# Patient Record
Sex: Female | Born: 1995
Health system: Southern US, Community
[De-identification: ages and names within clinical notes are randomized; demographics above are authoritative.]

## PROBLEM LIST (undated history)

## (undated) HISTORY — PX: MOUTH SURGERY: SHX715

---
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
## 2016-06-21 ENCOUNTER — Encounter (HOSPITAL_BASED_OUTPATIENT_CLINIC_OR_DEPARTMENT_OTHER): Payer: Self-pay | Admitting: *Deleted

## 2016-06-21 ENCOUNTER — Emergency Department (HOSPITAL_BASED_OUTPATIENT_CLINIC_OR_DEPARTMENT_OTHER)
Admission: EM | Admit: 2016-06-21 | Discharge: 2016-06-21 | Disposition: A | Discharge status: Home / Self Care | Payer: Medicaid Other | Attending: Emergency Medicine | Admitting: Emergency Medicine

## 2016-06-21 ENCOUNTER — Emergency Department (HOSPITAL_BASED_OUTPATIENT_CLINIC_OR_DEPARTMENT_OTHER): Payer: Medicaid Other

## 2016-06-21 DIAGNOSIS — M791 Myalgia: Secondary | ICD-10-CM | POA: Insufficient documentation

## 2016-06-21 DIAGNOSIS — R69 Illness, unspecified: Secondary | ICD-10-CM

## 2016-06-21 DIAGNOSIS — R51 Headache: Secondary | ICD-10-CM | POA: Diagnosis not present

## 2016-06-21 DIAGNOSIS — J029 Acute pharyngitis, unspecified: Secondary | ICD-10-CM | POA: Insufficient documentation

## 2016-06-21 DIAGNOSIS — R05 Cough: Secondary | ICD-10-CM | POA: Diagnosis present

## 2016-06-21 DIAGNOSIS — J111 Influenza due to unidentified influenza virus with other respiratory manifestations: Secondary | ICD-10-CM

## 2016-06-21 DIAGNOSIS — J40 Bronchitis, not specified as acute or chronic: Secondary | ICD-10-CM | POA: Diagnosis not present

## 2016-06-21 MED ORDER — ACETAMINOPHEN 325 MG PO TABS
650.0000 mg | ORAL_TABLET | Freq: Once | ORAL | Status: AC
  Administered 2016-06-21: 650 mg via ORAL
  Filled 2016-06-21: qty 2

## 2016-06-21 MED ORDER — OSELTAMIVIR PHOSPHATE 75 MG PO CAPS: 75.0000 mg | ORAL_CAPSULE | Freq: Two times a day (BID) | ORAL | 0 refills | Status: DC

## 2016-06-21 MED FILL — TAMIFLU 75 MG GELCAP: 75 | 5 days supply | Qty: 10 | Fill #0

## 2016-06-21 NOTE — ED Notes (Signed)
ED Provider at bedside. 

## 2016-06-21 NOTE — ED Provider Notes (Signed)
[facility] Provider Note   CSN: [id number] Arrival date & time: 06/21/16  1224   By signing my name below, I, Teofilo Pod, attest that this documentation has been prepared under the direction and in the presence of Vanetta Mulders, MD . Electronically Signed: Teofilo Pod, ED Scribe. 06/21/2016. 3:26 PM.   History   Chief Complaint Chief Complaint  Patient presents with  . Cough    The history is provided by the patient. No language interpreter was used.   HPI Comments:  Yvonne Hancock is a 21 y.o. female who presents to the Emergency Department complaining of a persistent, productive cough x 2 days. Pt was seen at [facility] pediatrics yesterday and tested positive for flu and was prescribed tamiflu, Z-pak, and prednisone but [facility] was only able to fill the prednisone. Mom reports that pt was coughing with bloody sputum while the pt was at school today. Pt complains of associated chest tightness, headache x 3 days, fever, congestion, sore throat, SOB, and generalized body aches. Pt states that the prednisone has not provided any relief. Pt denies visual changes, chest pain, leg swelling, abdominal pain, diarrhea, vomiting, dysuria, hematuria, and rash.   History reviewed. No pertinent past medical history.  There are no active problems to display for this patient.   Past Surgical History:  Procedure Laterality Date  . MOUTH SURGERY      OB History    No data available       Home Medications    Prior to Admission medications   Medication Sig Start Date End Date Taking? Authorizing Provider  metroNIDAZOLE (FLAGYL) 500 MG tablet Take 1 tablet (500 mg total) by mouth 2 (two) times daily. 04/23/15   Eber Hong, MD  oseltamivir (TAMIFLU) 75 MG capsule Take 1 capsule (75 mg total) by mouth every 12 (twelve) hours. 06/21/16   Vanetta Mulders, MD    Family History No family history on file.  Social History Social History  Substance Use Topics   . Smoking status: Never Smoker  . Smokeless tobacco: Never Used  . Alcohol use No     Allergies   Patient has no known allergies.   Review of Systems Review of Systems  Constitutional: Positive for fever.  HENT: Positive for congestion and sore throat.   Eyes: Negative for visual disturbance.  Respiratory: Positive for cough, chest tightness and shortness of breath.   Cardiovascular: Negative for chest pain and leg swelling.  Gastrointestinal: Negative for abdominal pain, diarrhea and vomiting.  Genitourinary: Negative for dysuria and hematuria.  Musculoskeletal: Positive for myalgias.  Skin: Negative for rash.  Neurological: Positive for headaches.  Hematological: Does not bruise/bleed easily.     Physical Exam Updated Vital Signs BP 120/77 (BP Location: Left Arm)   Pulse 103   Temp 98.7 F (37.1 C) (Oral)   Resp 20   Ht 5' 1.5" (1.562 m)   Wt 63.5 kg   LMP  (LMP Unknown)   SpO2 97%   BMI 26.02 kg/m   Physical Exam  Constitutional: She is oriented to person, place, and time. She appears well-developed and well-nourished. No distress.  HENT:  Head: Normocephalic and atraumatic.  Mouth/Throat: Oropharynx is clear and moist.  Eyes: Conjunctivae and EOM are normal. Pupils are equal, round, and reactive to light.  Sclera clear, eyes tracking normal.   Neck: Neck supple.  Cardiovascular: Normal rate and regular rhythm.   No murmur heard. Tachycardic.  Pulmonary/Chest: Effort normal and breath sounds normal. No  respiratory distress.  Decreased breath sounds. Lungs clear.  Abdominal: Soft. Bowel sounds are normal. There is no tenderness.  Musculoskeletal: Normal range of motion. She exhibits no edema.  Neurological: She is alert and oriented to person, place, and time. No cranial nerve deficit or sensory deficit. She exhibits normal muscle tone. Coordination normal.  Skin: Skin is warm and dry.  Psychiatric: She has a normal mood and affect.  Nursing note and  vitals reviewed.    ED Treatments / Results  DIAGNOSTIC STUDIES:  Oxygen Saturation is 97% on RA, normal by my interpretation.    COORDINATION OF CARE:  3:22 PM Will order chest x-ray. Discussed treatment plan with pt at bedside and pt agreed to plan.   Labs (all labs ordered are listed, but only abnormal results are displayed) Labs Reviewed - No data to display  EKG  EKG Interpretation None       Radiology Dg Chest 2 View  Result Date: 06/21/2016 CLINICAL DATA:  Headache, cough and fever for 2 days. EXAM: CHEST  2 VIEW COMPARISON:  06/04/2016 FINDINGS: The heart size and mediastinal contours are within normal limits. Both lungs are clear. Mild peribronchial thickening. The visualized skeletal structures are unremarkable. IMPRESSION: Mild peribronchial thickening could suggest bronchitis. No infiltrates or effusions. Electronically Signed   By: Rudie MeyerP.  Gallerani M.D.   On: 06/21/2016 15:47    Procedures Procedures (including critical care time)  Medications Ordered in ED Medications  acetaminophen (TYLENOL) tablet 650 mg (650 mg Oral Given 06/21/16 1248)     Initial Impression / Assessment and Plan / ED Course  I have reviewed the triage vital signs and the nursing notes.  Pertinent labs & imaging results that were available during my care of the patient were reviewed by me and considered in my medical decision making (see chart for details).    Symptoms consistent with flulike illness. Patient also did have positive influenza test at primary care doctor's office yesterday. Patient's coughing up a Bungert bit of blood with phlegm. Consistent with a bronchitis. Chest x-rays negative for pneumonia. Will have patient continue her prednisone encouraged her to start the Tamiflu today. Patient to take over-the-counter cold and flu meds as well. Patient nontoxic no acute distress.   Final Clinical Impressions(s) / ED Diagnoses   Final diagnoses:  Influenza-like illness    Bronchitis    New Prescriptions New Prescriptions   OSELTAMIVIR (TAMIFLU) 75 MG CAPSULE    Take 1 capsule (75 mg total) by mouth every 12 (twelve) hours.    I personally performed the services described in this documentation, which was scribed in my presence. The recorded information has been reviewed and is accurate.       Vanetta MuldersScott Marga Gramajo, MD 06/21/16 [phone]

## 2016-06-21 NOTE — Discharge Instructions (Signed)
Take the Tamiflu as directed. Continue take the prednisone as prescribed by your primary care doctor. Chest x-rays negative for pneumonia. Taking Zithromax will be very helpful. Recommend also over-the-counter cold and flu medicines. Return for any new or worse symptoms.

## 2016-06-21 NOTE — ED Triage Notes (Signed)
She has been seen x 2 for cough. She had a positive flu test yesterday. She was started on Prednisone, Tamaflu an Zpak. She saw streaks of blood in her cough returns this am.

## 2016-08-15 ENCOUNTER — Encounter (HOSPITAL_BASED_OUTPATIENT_CLINIC_OR_DEPARTMENT_OTHER): Payer: Self-pay | Admitting: Emergency Medicine

## 2016-08-15 ENCOUNTER — Emergency Department (HOSPITAL_BASED_OUTPATIENT_CLINIC_OR_DEPARTMENT_OTHER)
Admission: EM | Admit: 2016-08-15 | Discharge: 2016-08-16 | Disposition: A | Discharge status: Home / Self Care | Payer: Medicaid Other | Attending: Emergency Medicine | Admitting: Emergency Medicine

## 2016-08-15 DIAGNOSIS — N760 Acute vaginitis: Secondary | ICD-10-CM | POA: Insufficient documentation

## 2016-08-15 DIAGNOSIS — B9689 Other specified bacterial agents as the cause of diseases classified elsewhere: Secondary | ICD-10-CM

## 2016-08-15 DIAGNOSIS — N898 Other specified noninflammatory disorders of vagina: Secondary | ICD-10-CM | POA: Diagnosis present

## 2016-08-15 LAB — URINALYSIS, ROUTINE W REFLEX MICROSCOPIC
BILIRUBIN URINE: NEGATIVE
GLUCOSE, UA: NEGATIVE mg/dL
Hgb urine dipstick: NEGATIVE
Ketones, ur: NEGATIVE mg/dL
Leukocytes, UA: NEGATIVE
Nitrite: NEGATIVE
PH: 8 (ref 5.0–8.0)
Protein, ur: NEGATIVE mg/dL
SPECIFIC GRAVITY, URINE: 1.024 (ref 1.005–1.030)

## 2016-08-15 LAB — URINALYSIS, MICROSCOPIC (REFLEX)

## 2016-08-15 LAB — PREGNANCY, URINE: Preg Test, Ur: NEGATIVE

## 2016-08-15 NOTE — ED Triage Notes (Signed)
Patient reports intermittent "sharp" pain in right lower back x 1 week.  Denies injury, dysuria, hematuria.

## 2016-08-15 NOTE — ED Notes (Signed)
Pt reports thick white vaginal d/c since Monday, pt reports recent unprotected sex. Pt denies dysuria.

## 2016-08-15 NOTE — ED Provider Notes (Signed)
[facility] Provider Note   CSN: [id number] Arrival date & time: 08/15/16  2253   By signing my name below, I, Yvonne Hancock, attest that this documentation has been prepared under the direction and in the presence of Yvonne Nienhuis, MD . Electronically Signed: Teofilo PodMatthew P. Hancock, ED Scribe. 08/15/2016. 11:56 PM.   History   Chief Complaint Chief Complaint  Patient presents with  . Back Pain  . Vaginal Discharge    The history is provided by the patient. No language interpreter was used.  Vaginal Discharge   This is a new problem. The current episode started more than 2 days ago. The problem occurs constantly. The problem has not changed since onset.The discharge occurs spontaneously. The discharge was white. She is not pregnant. She has not missed her period. Pertinent negatives include no fever, no abdominal pain, no genital lesions and no perineal odor. She has tried nothing for the symptoms. The treatment provided no relief. Her past medical history does not include irregular periods.   HPI Comments:  Yvonne Hancock is a 21 y.o. female who presents to the Emergency Department complaining of persistent vaginal discharge that began 1 weeks ago. Pt reports hx of yeast infection and states that the discharge is similar. She describes the discharge as white. Pt was given a cream for her last yeast infection.    History reviewed. No pertinent past medical history.  There are no active problems to display for this patient.   Past Surgical History:  Procedure Laterality Date  . MOUTH SURGERY      OB History    No data available       Home Medications    Prior to Admission medications   Medication Sig Start Date End Date Taking? Authorizing Provider  amoxicillin (AMOXIL) 500 MG tablet Take 400 mg by mouth 2 (two) times daily.   Yes Historical Provider, MD  metroNIDAZOLE (FLAGYL) 500 MG tablet Take 1 tablet (500 mg total) by mouth 2 (two) times daily.  04/23/15   Eber HongBrian Miller, MD  oseltamivir (TAMIFLU) 75 MG capsule Take 1 capsule (75 mg total) by mouth every 12 (twelve) hours. 06/21/16   Vanetta MuldersScott Zackowski, MD    Family History History reviewed. No pertinent family history.  Social History Social History  Substance Use Topics  . Smoking status: Never Smoker  . Smokeless tobacco: Never Used  . Alcohol use No     Allergies   Patient has no known allergies.   Review of Systems Review of Systems  Constitutional: Negative for fever.  Gastrointestinal: Negative for abdominal pain.  Genitourinary: Positive for vaginal discharge. Negative for difficulty urinating, genital sores and vaginal bleeding.  Neurological: Negative for syncope, weakness and numbness.  All other systems reviewed and are negative.    Physical Exam Updated Vital Signs BP 126/80 (BP Location: Left Arm)   Pulse 84   Temp 97.9 F (36.6 C) (Oral)   Resp 17   Ht 5\' 1"  (1.549 m)   Wt 131 lb (59.4 kg)   LMP 05/24/2016 (Approximate)   SpO2 100%   BMI 24.75 kg/m   Physical Exam  Constitutional: She is oriented to person, place, and time. She appears well-developed and well-nourished. No distress.  HENT:  Head: Normocephalic and atraumatic.  Mouth/Throat: Oropharynx is clear and moist. No oropharyngeal exudate.  Trachea midline  Eyes: Conjunctivae and EOM are normal. Pupils are equal, round, and reactive to light.  Neck: Trachea normal and normal range of motion. Neck supple. No  JVD present. Carotid bruit is not present.  Cardiovascular: Normal rate, regular rhythm, normal heart sounds and intact distal pulses.  Exam reveals no gallop and no friction rub.   No murmur heard. Pulmonary/Chest: Effort normal and breath sounds normal. No stridor. She has no wheezes. She has no rales.  Abdominal: Soft. Bowel sounds are normal. She exhibits no mass. There is no tenderness. There is no rebound and no guarding.  Genitourinary: Vaginal discharge found.  Genitourinary  Comments: No CMT, no adnexal tenderness; yellow discharge, not copious  Musculoskeletal: Normal range of motion.  Lymphadenopathy:    She has no cervical adenopathy.  Neurological: She is alert and oriented to person, place, and time. She has normal reflexes. She displays normal reflexes. No cranial nerve deficit. She exhibits normal muscle tone. Coordination normal.  Cranial nerves 2-12 intact  Skin: Skin is warm and dry. Capillary refill takes less than 2 seconds. She is not diaphoretic.  Psychiatric: She has a normal mood and affect. Her behavior is normal.     ED Treatments / Results   Vitals:   08/15/16 2258 08/16/16 0107  BP: 126/80 (!) 103/57  Pulse: 84 66  Resp: 17 14  Temp: 97.9 F (36.6 C)     DIAGNOSTIC STUDIES:  Oxygen Saturation is 100% on RA, normal by my interpretation.    COORDINATION OF CARE:  11:56 PM Discussed treatment plan with pt at bedside and pt agreed to plan.   Labs (all labs ordered are listed, but only abnormal results are displayed) Results for orders placed or performed during the hospital encounter of 08/15/16  Wet prep, genital  Result Value Ref Range   Yeast Wet Prep HPF POC NONE SEEN NONE SEEN   Trich, Wet Prep NONE SEEN NONE SEEN   Clue Cells Wet Prep HPF POC PRESENT (A) NONE SEEN   WBC, Wet Prep HPF POC MODERATE (A) NONE SEEN   Sperm NONE SEEN   Urinalysis, Routine w reflex microscopic  Result Value Ref Range   Color, Urine YELLOW YELLOW   APPearance TURBID (A) CLEAR   Specific Gravity, Urine 1.024 1.005 - 1.030   pH 8.0 5.0 - 8.0   Glucose, UA NEGATIVE NEGATIVE mg/dL   Hgb urine dipstick NEGATIVE NEGATIVE   Bilirubin Urine NEGATIVE NEGATIVE   Ketones, ur NEGATIVE NEGATIVE mg/dL   Protein, ur NEGATIVE NEGATIVE mg/dL   Nitrite NEGATIVE NEGATIVE   Leukocytes, UA NEGATIVE NEGATIVE  Pregnancy, urine  Result Value Ref Range   Preg Test, Ur NEGATIVE NEGATIVE  Urinalysis, Microscopic (reflex)  Result Value Ref Range   RBC / HPF  0-5 0 - 5 RBC/hpf   WBC, UA 0-5 0 - 5 WBC/hpf   Bacteria, UA RARE (A) NONE SEEN   Squamous Epithelial / LPF 0-5 (A) NONE SEEN   Amorphous Crystal PRESENT    No results found.  Procedures Procedures (including critical care time)  Medications Ordered in ED Medications  metroNIDAZOLE (FLAGYL) tablet 500 mg (500 mg Oral Given 08/16/16 0143)     Final Clinical Impressions(s) / ED Diagnoses  Bacterial vaginosis:  Will treat for same.  The patient is nontoxic-appearing on exam and vital signs are within normal limits. Return for weakness, vomiting, worsening discharge, fevers abdominal pain or any concerns.   After history, exam, and medical workup I feel the patient has been appropriately medically screened and is safe for discharge home. Pertinent diagnoses were discussed with the patient. Patient was given return precautions.  I personally performed the services described  in this documentation, which was scribed in my presence. The recorded information has been reviewed and is accurate.        Cy Blamer, MD 08/16/16 [phone]

## 2016-08-16 ENCOUNTER — Encounter (HOSPITAL_BASED_OUTPATIENT_CLINIC_OR_DEPARTMENT_OTHER): Payer: Self-pay | Admitting: Emergency Medicine

## 2016-08-16 LAB — GC/CHLAMYDIA PROBE AMP (~~LOC~~) NOT AT ARMC
CHLAMYDIA, DNA PROBE: NEGATIVE
NEISSERIA GONORRHEA: NEGATIVE

## 2016-08-16 LAB — WET PREP, GENITAL
SPERM: NONE SEEN
Trich, Wet Prep: NONE SEEN
Yeast Wet Prep HPF POC: NONE SEEN

## 2016-08-16 MED ORDER — METRONIDAZOLE 500 MG PO TABS: 500.0000 mg | ORAL_TABLET | Freq: Two times a day (BID) | ORAL | 0 refills | Status: DC

## 2016-08-16 MED ORDER — METRONIDAZOLE 500 MG PO TABS
500.0000 mg | ORAL_TABLET | Freq: Once | ORAL | Status: AC
  Administered 2016-08-16: 500 mg via ORAL
  Filled 2016-08-16: qty 1

## 2016-12-13 ENCOUNTER — Emergency Department (HOSPITAL_BASED_OUTPATIENT_CLINIC_OR_DEPARTMENT_OTHER)
Admission: EM | Admit: 2016-12-13 | Discharge: 2016-12-13 | Disposition: A | Discharge status: Home / Self Care | Payer: Medicaid Other | Attending: Emergency Medicine | Admitting: Emergency Medicine

## 2016-12-13 ENCOUNTER — Encounter (HOSPITAL_BASED_OUTPATIENT_CLINIC_OR_DEPARTMENT_OTHER): Payer: Self-pay

## 2016-12-13 DIAGNOSIS — N939 Abnormal uterine and vaginal bleeding, unspecified: Secondary | ICD-10-CM | POA: Insufficient documentation

## 2016-12-13 LAB — URINALYSIS, MICROSCOPIC (REFLEX)

## 2016-12-13 LAB — URINALYSIS, ROUTINE W REFLEX MICROSCOPIC

## 2016-12-13 LAB — PREGNANCY, URINE: Preg Test, Ur: NEGATIVE

## 2016-12-13 NOTE — ED Provider Notes (Signed)
[facility] Provider Note   CSN: [id number] Arrival date & time: 12/13/16  2032  By signing my name below, I, Yvonne Hancock, attest that this documentation has been prepared under the direction and in the presence of physician practitioner, Yvonne Hancock, Yvonne Reger, MD. Electronically Signed: Linna Darnerussell Hancock, Scribe. 12/13/2016. 9:14 PM.  History   Chief Complaint Chief Complaint  Patient presents with  . Vaginal Bleeding   The history is provided by the patient. No language interpreter was used.    HPI Comments: Yvonne Hancock is a 21 y.o. female who presents to the Emergency Department for evaluation of two days of irregular vaginal bleeding. Patient's most recent normal menstrual period ended on 7/11. She reports some associated suprapubic pain, nausea, and vomiting. Patient notes that she has been passing large blood clots. She has tried Aleve without improvement of her abdominal pain. She has no prior h/o irregular menstrual periods. Her BM's have been normal. Patient denies fevers, chills, dysuria, urinary retention, vaginal discharge, or any other associated symptoms.  History reviewed. No pertinent past medical history.  There are no active problems to display for this patient.   Past Surgical History:  Procedure Laterality Date  . MOUTH SURGERY      OB History    No data available       Home Medications    Prior to Admission medications   Not on File    Family History No family history on file.  Social History Social History  Substance Use Topics  . Smoking status: Never Smoker  . Smokeless tobacco: Never Used  . Alcohol use No     Allergies   Patient has no known allergies.   Review of Systems Review of Systems  Constitutional: Negative for chills, diaphoresis, fatigue and fever.  HENT: Negative for congestion, rhinorrhea and sneezing.   Eyes: Negative.   Respiratory: Negative for cough, chest tightness and shortness of breath.     Cardiovascular: Negative for chest pain and leg swelling.  Gastrointestinal: Positive for abdominal pain, nausea and vomiting. Negative for blood in stool, constipation and diarrhea.  Genitourinary: Positive for vaginal bleeding. Negative for difficulty urinating, dysuria, flank pain, frequency and hematuria.  Musculoskeletal: Negative for arthralgias and back pain.  Skin: Negative for rash.  Neurological: Negative for dizziness, speech difficulty, weakness, numbness and headaches.   Physical Exam Updated Vital Signs BP 106/66 (BP Location: Left Arm)   Pulse 70   Temp 98.4 F (36.9 C) (Oral)   Resp 18   Ht 5' (1.524 m)   Wt 55.3 kg (122 lb)   LMP 12/11/2016   SpO2 100%   BMI 23.83 kg/m   Physical Exam  Constitutional: She is oriented to person, place, and time. She appears well-developed and well-nourished.  HENT:  Head: Normocephalic and atraumatic.  Eyes: Pupils are equal, round, and reactive to light.  Neck: Normal range of motion. Neck supple.  Cardiovascular: Normal rate, regular rhythm and normal heart sounds.   Pulmonary/Chest: Effort normal and breath sounds normal. No respiratory distress. She has no wheezes. She has no rales. She exhibits no tenderness.  Abdominal: Soft. Bowel sounds are normal. There is tenderness. There is no rebound and no guarding.  Mild tenderness in the suprapubic area.  Musculoskeletal: Normal range of motion. She exhibits no edema.  Lymphadenopathy:    She has no cervical adenopathy.  Neurological: She is alert and oriented to person, place, and time.  Skin: Skin is warm and dry. No rash noted.  Psychiatric: She  has a normal mood and affect.   ED Treatments / Results  Labs (all labs ordered are listed, but only abnormal results are displayed) Labs Reviewed  URINALYSIS, ROUTINE W REFLEX MICROSCOPIC - Abnormal; Notable for the following:       Result Value   Color, Urine RED (*)    APPearance TURBID (*)    Glucose, UA   (*)    Value:  TEST NOT REPORTED DUE TO COLOR INTERFERENCE OF URINE PIGMENT   Hgb urine dipstick   (*)    Value: TEST NOT REPORTED DUE TO COLOR INTERFERENCE OF URINE PIGMENT   Bilirubin Urine   (*)    Value: TEST NOT REPORTED DUE TO COLOR INTERFERENCE OF URINE PIGMENT   Ketones, ur   (*)    Value: TEST NOT REPORTED DUE TO COLOR INTERFERENCE OF URINE PIGMENT   Protein, ur   (*)    Value: TEST NOT REPORTED DUE TO COLOR INTERFERENCE OF URINE PIGMENT   Nitrite   (*)    Value: TEST NOT REPORTED DUE TO COLOR INTERFERENCE OF URINE PIGMENT   Leukocytes, UA   (*)    Value: TEST NOT REPORTED DUE TO COLOR INTERFERENCE OF URINE PIGMENT   All other components within normal limits  URINALYSIS, MICROSCOPIC (REFLEX) - Abnormal; Notable for the following:    Bacteria, UA FEW (*)    Squamous Epithelial / LPF 0-5 (*)    All other components within normal limits  PREGNANCY, URINE    EKG  EKG Interpretation None       Radiology No results found.  Procedures Procedures (including critical care time)  DIAGNOSTIC STUDIES: Oxygen Saturation is 100% on RA, normal by my interpretation.    COORDINATION OF CARE: 9:12 PM Discussed treatment plan with pt at bedside and pt agreed to plan.  Medications Ordered in ED Medications - No data to display   Initial Impression / Assessment and Plan / ED Course  I have reviewed the triage vital signs and the nursing notes.  Pertinent labs & imaging results that were available during my care of the patient were reviewed by me and considered in my medical decision making (see chart for details).     Patient presents with vaginal bleeding. She doesn't have any heavy bleeding currently. Her abdominal exam is benign. Her pregnancy test is negative. She was discharged home in good condition. She is advised to use ibuprofen for symptomatically. She was encouraged to follow-up with her OB/GYN if her symptoms are not improving.  Final Clinical Impressions(s) / ED Diagnoses    Final diagnoses:  Vaginal bleeding    New Prescriptions New Prescriptions   No medications on file   I personally performed the services described in this documentation, which was scribed in my presence.  The recorded information has been reviewed and considered.    Yvonne Bucco, MD 12/13/16 2148

## 2016-12-13 NOTE — ED Triage Notes (Signed)
C/o vaginal bleeding x 2 days-LMP prior to today bleeding 7/3-NAD-steady gait

## 2018-01-29 ENCOUNTER — Emergency Department (HOSPITAL_BASED_OUTPATIENT_CLINIC_OR_DEPARTMENT_OTHER)
Admission: EM | Admit: 2018-01-29 | Discharge: 2018-01-29 | Disposition: A | Discharge status: Home / Self Care | Payer: Self-pay | Attending: Emergency Medicine | Admitting: Emergency Medicine

## 2018-01-29 ENCOUNTER — Other Ambulatory Visit: Payer: Self-pay

## 2018-01-29 ENCOUNTER — Encounter (HOSPITAL_BASED_OUTPATIENT_CLINIC_OR_DEPARTMENT_OTHER): Payer: Self-pay | Admitting: Adult Health

## 2018-01-29 DIAGNOSIS — X58XXXA Exposure to other specified factors, initial encounter: Secondary | ICD-10-CM | POA: Insufficient documentation

## 2018-01-29 DIAGNOSIS — S61306A Unspecified open wound of right little finger with damage to nail, initial encounter: Secondary | ICD-10-CM | POA: Insufficient documentation

## 2018-01-29 DIAGNOSIS — Y9389 Activity, other specified: Secondary | ICD-10-CM | POA: Insufficient documentation

## 2018-01-29 DIAGNOSIS — S61309A Unspecified open wound of unspecified finger with damage to nail, initial encounter: Secondary | ICD-10-CM

## 2018-01-29 DIAGNOSIS — Y998 Other external cause status: Secondary | ICD-10-CM | POA: Insufficient documentation

## 2018-01-29 DIAGNOSIS — Y929 Unspecified place or not applicable: Secondary | ICD-10-CM | POA: Insufficient documentation

## 2018-01-29 NOTE — ED Triage Notes (Signed)
Presents with no nail to right 5th finger. She states her fake nails pulled the nail off.

## 2018-01-29 NOTE — Discharge Instructions (Signed)
Please read and follow all provided instructions.  Your diagnoses today include:  1. Traumatic avulsion of nail plate of finger, initial encounter     Tests performed today include:  Vital signs. See below for your results today.   Medications prescribed:   None  Home care instructions:  Follow any educational materials contained in this packet.  Follow-up instructions: Please follow-up with your primary care provider as needed for further evaluation of your symptoms.  Return instructions:   Please return to the Emergency Department if you experience worsening symptoms.   Please return if you have any other emergent concerns.  Additional Information:  Your vital signs today were: BP 113/69 (BP Location: Left Arm)    Pulse (!) 112    Temp 98.8 F (37.1 C) (Oral)    Resp 18    Ht 5\' 2"  (1.575 m)    Wt 56.2 kg    SpO2 98%    BMI 22.68 kg/m  If your blood pressure (BP) was elevated above 135/85 this visit, please have this repeated by your doctor within one month. ---------------

## 2018-01-29 NOTE — ED Provider Notes (Signed)
[facility] EMERGENCY DEPARTMENT Provider Note   CSN: [id number] Arrival date & time: 01/29/18  1248     History   Chief Complaint Chief Complaint  Patient presents with  . Nail Problem    HPI Yvonne Hancock is a 22 y.o. female.  Patient presents the emergency department with complaint of fingernail injury to the right small finger.  Patient has fake nails and when she tried to remove it her fingernail tore.  She currently complains of pain.  No bleeding or drainage.  No other injuries reported.     History reviewed. No pertinent past medical history.  There are no active problems to display for this patient.   Past Surgical History:  Procedure Laterality Date  . MOUTH SURGERY       OB History   None      Home Medications    Prior to Admission medications   Not on File    Family History History reviewed. No pertinent family history.  Social History Social History   Tobacco Use  . Smoking status: Never Smoker  . Smokeless tobacco: Never Used  Substance Use Topics  . Alcohol use: No  . Drug use: No     Allergies   Patient has no known allergies.   Review of Systems Review of Systems  Musculoskeletal: Negative for arthralgias and myalgias.  Skin: Positive for wound.     Physical Exam Updated Vital Signs BP 113/69 (BP Location: Left Arm)   Pulse (!) 112   Temp 98.8 F (37.1 C) (Oral)   Resp 18   Ht 5\' 2"  (1.575 m)   Wt 56.2 kg   SpO2 98%   BMI 22.68 kg/m   Physical Exam  Constitutional: She appears well-developed and well-nourished.  HENT:  Head: Normocephalic.  Skin:  Right fifth nail plate removed with approximately the distal one half of the nailbed exposed.  There is no nailbed lacerations.  Small wound on the fingertip, hemostatic.  No indications for repair.  Nail matrix appears intact with remaining nail plate protruding and well adhered to the proximal nail bed.     ED Treatments / Results  Labs (all labs  ordered are listed, but only abnormal results are displayed) Labs Reviewed - No data to display  EKG None  Radiology No results found.  Procedures Procedures (including critical care time)  Medications Ordered in ED Medications - No data to display   Initial Impression / Assessment and Plan / ED Course  I have reviewed the triage vital signs and the nursing notes.  Pertinent labs & imaging results that were available during my care of the patient were reviewed by me and considered in my medical decision making (see chart for details).     Patient seen and examined.   Vital signs reviewed and are as follows: BP 113/69 (BP Location: Left Arm)   Pulse (!) 112   Temp 98.8 F (37.1 C) (Oral)   Resp 18   Ht 5\' 2"  (1.575 m)   Wt 56.2 kg   SpO2 98%   BMI 22.68 kg/m   Patient counseled on good wound care, expectant course of healing.  Return with worsening redness, swelling, bleeding  Final Clinical Impressions(s) / ED Diagnoses   Final diagnoses:  Traumatic avulsion of nail plate of finger, initial encounter   Patient with minor avulsion of the nail plate of the right fifth finger.  No matrix and nail plate is intact proximally.  No nailbed lacerations noted.  Patient counseled on care.  ED Discharge Orders    None       Renne Crigler, PA-C 01/29/18 1311    Sabas Sous, MD 01/29/18 [phone]

## 2019-04-24 ENCOUNTER — Emergency Department (HOSPITAL_BASED_OUTPATIENT_CLINIC_OR_DEPARTMENT_OTHER): Payer: Self-pay

## 2019-04-24 ENCOUNTER — Emergency Department (HOSPITAL_BASED_OUTPATIENT_CLINIC_OR_DEPARTMENT_OTHER)
Admission: EM | Admit: 2019-04-24 | Discharge: 2019-04-25 | Disposition: A | Discharge status: Home / Self Care | Payer: Self-pay | Attending: Emergency Medicine | Admitting: Emergency Medicine

## 2019-04-24 ENCOUNTER — Encounter (HOSPITAL_BASED_OUTPATIENT_CLINIC_OR_DEPARTMENT_OTHER): Payer: Self-pay | Admitting: Emergency Medicine

## 2019-04-24 ENCOUNTER — Other Ambulatory Visit: Payer: Self-pay

## 2019-04-24 DIAGNOSIS — R05 Cough: Secondary | ICD-10-CM | POA: Insufficient documentation

## 2019-04-24 DIAGNOSIS — R112 Nausea with vomiting, unspecified: Secondary | ICD-10-CM

## 2019-04-24 DIAGNOSIS — M791 Myalgia, unspecified site: Secondary | ICD-10-CM

## 2019-04-24 DIAGNOSIS — Z20822 Contact with and (suspected) exposure to covid-19: Secondary | ICD-10-CM

## 2019-04-24 DIAGNOSIS — R430 Anosmia: Secondary | ICD-10-CM

## 2019-04-24 DIAGNOSIS — R059 Cough, unspecified: Secondary | ICD-10-CM

## 2019-04-24 DIAGNOSIS — R6883 Chills (without fever): Secondary | ICD-10-CM

## 2019-04-24 DIAGNOSIS — F17210 Nicotine dependence, cigarettes, uncomplicated: Secondary | ICD-10-CM | POA: Insufficient documentation

## 2019-04-24 DIAGNOSIS — R197 Diarrhea, unspecified: Secondary | ICD-10-CM | POA: Insufficient documentation

## 2019-04-24 DIAGNOSIS — R432 Parageusia: Secondary | ICD-10-CM

## 2019-04-24 DIAGNOSIS — Z20828 Contact with and (suspected) exposure to other viral communicable diseases: Secondary | ICD-10-CM | POA: Insufficient documentation

## 2019-04-24 LAB — CBC WITH DIFFERENTIAL/PLATELET
Abs Immature Granulocytes: 0.01 10*3/uL (ref 0.00–0.07)
Basophils Absolute: 0 10*3/uL (ref 0.0–0.1)
Basophils Relative: 1 %
Eosinophils Absolute: 0.1 10*3/uL (ref 0.0–0.5)
Eosinophils Relative: 2 %
HCT: 37.5 % (ref 36.0–46.0)
Hemoglobin: 11.4 g/dL — ABNORMAL LOW (ref 12.0–15.0)
Immature Granulocytes: 0 %
Lymphocytes Relative: 49 %
Lymphs Abs: 2.7 10*3/uL (ref 0.7–4.0)
MCH: 22.5 pg — ABNORMAL LOW (ref 26.0–34.0)
MCHC: 30.4 g/dL (ref 30.0–36.0)
MCV: 74 fL — ABNORMAL LOW (ref 80.0–100.0)
Monocytes Absolute: 0.5 10*3/uL (ref 0.1–1.0)
Monocytes Relative: 10 %
Neutro Abs: 2.1 10*3/uL (ref 1.7–7.7)
Neutrophils Relative %: 38 %
Platelets: 287 10*3/uL (ref 150–400)
RBC: 5.07 MIL/uL (ref 3.87–5.11)
RDW: 14.2 % (ref 11.5–15.5)
WBC: 5.5 10*3/uL (ref 4.0–10.5)
nRBC: 0 % (ref 0.0–0.2)

## 2019-04-24 MED ORDER — ONDANSETRON HCL 4 MG/2ML IJ SOLN
4.0000 mg | Freq: Once | INTRAMUSCULAR | Status: AC
  Administered 2019-04-24: 4 mg via INTRAVENOUS
  Filled 2019-04-24: qty 2

## 2019-04-24 MED ORDER — SODIUM CHLORIDE 0.9 % IV BOLUS
1000.0000 mL | Freq: Once | INTRAVENOUS | Status: AC
  Administered 2019-04-24: 1000 mL via INTRAVENOUS

## 2019-04-24 NOTE — ED Notes (Signed)
Pt. Reports last period was 10 days ago

## 2019-04-24 NOTE — ED Notes (Signed)
Pt. Reports for 5 days no taste no smell and cough with runny nose and diarrhea.  Pt. Said she has had sore throat and sore lymph nodes.

## 2019-04-24 NOTE — ED Triage Notes (Signed)
Pt states she had to leave work today due to vomiting and diarrhea  Pt states she has not been feeling good for the past 5 days  Pt states she has headache, sore throat, fatigue, loss of taste and smell, and cough

## 2019-04-24 NOTE — ED Provider Notes (Signed)
[facility] EMERGENCY DEPARTMENT Provider Note   CSN: [id number] Arrival date & time: 04/24/19  2109     History   Chief Complaint Chief Complaint  Patient presents with   covid symptoms    HPI Yvonne Hancock is a 23 y.o. female.     The history is provided by the patient and medical records. No language interpreter was used.  Cough Cough characteristics:  Productive Sputum characteristics:  Bloody, green and yellow Severity:  Moderate Onset quality:  Gradual Duration:  5 days Timing:  Constant Progression:  Waxing and waning Chronicity:  New Relieved by:  Nothing Worsened by:  Nothing Ineffective treatments:  None tried Associated symptoms: chills, fever, myalgias, rhinorrhea, sinus congestion and sore throat   Associated symptoms: no chest pain, no diaphoresis, no rash, no shortness of breath and no wheezing     History reviewed. No pertinent past medical history.  There are no active problems to display for this patient.   Past Surgical History:  Procedure Laterality Date   MOUTH SURGERY       OB History   No obstetric history on file.      Home Medications    Prior to Admission medications   Not on File    Family History History reviewed. No pertinent family history.  Social History Social History   Tobacco Use   Smoking status: Current Every Day Smoker    Types: Cigars   Smokeless tobacco: Never Used  Substance Use Topics   Alcohol use: Yes    Comment: social   Drug use: No     Allergies   Patient has no known allergies.   Review of Systems Review of Systems  Constitutional: Positive for chills, fatigue and fever. Negative for diaphoresis.  HENT: Positive for congestion, rhinorrhea and sore throat.   Eyes: Negative for visual disturbance.  Respiratory: Positive for cough and chest tightness. Negative for shortness of breath, wheezing and stridor.   Cardiovascular: Negative for chest pain.  Gastrointestinal:  Positive for diarrhea, nausea and vomiting. Negative for constipation.  Genitourinary: Negative for dysuria, flank pain and frequency.  Musculoskeletal: Positive for myalgias. Negative for back pain, neck pain and neck stiffness.  Skin: Negative for rash and wound.  Neurological: Negative for light-headedness.  Psychiatric/Behavioral: Negative for agitation.  All other systems reviewed and are negative.    Physical Exam Updated Vital Signs BP 136/88 (BP Location: Left Arm)    Pulse (!) 104    Temp 98.6 F (37 C) (Oral)    Resp 16    Ht 5\' 2"  (1.575 m)    Wt 54.9 kg    LMP 04/13/2019 (Approximate)    SpO2 99%    BMI 22.13 kg/m   Physical Exam Vitals signs and nursing note reviewed.  Constitutional:      General: She is not in acute distress.    Appearance: She is well-developed. She is not ill-appearing, toxic-appearing or diaphoretic.  HENT:     Head: Normocephalic and atraumatic.     Right Ear: External ear normal.     Left Ear: External ear normal.     Nose: Nose normal.     Mouth/Throat:     Mouth: Mucous membranes are moist.     Pharynx: No oropharyngeal exudate or posterior oropharyngeal erythema.  Eyes:     Extraocular Movements: Extraocular movements intact.     Conjunctiva/sclera: Conjunctivae normal.     Pupils: Pupils are equal, round, and reactive to light.  Neck:  Musculoskeletal: Normal range of motion and neck supple. No muscular tenderness.  Cardiovascular:     Rate and Rhythm: Normal rate and regular rhythm.     Pulses: Normal pulses.     Heart sounds: No murmur.  Pulmonary:     Effort: Pulmonary effort is normal. No respiratory distress.     Breath sounds: No stridor. No wheezing, rhonchi or rales.  Chest:     Chest wall: No tenderness.  Abdominal:     General: There is no distension.     Tenderness: There is no abdominal tenderness. There is no right CVA tenderness, left CVA tenderness or rebound.  Musculoskeletal:        General: No tenderness.    Skin:    General: Skin is warm.     Capillary Refill: Capillary refill takes less than 2 seconds.     Findings: No erythema or rash.  Neurological:     General: No focal deficit present.     Mental Status: She is alert and oriented to person, place, and time.     Motor: No abnormal muscle tone.     Deep Tendon Reflexes: Reflexes are normal and symmetric.  Psychiatric:        Mood and Affect: Mood normal.      ED Treatments / Results  Labs (all labs ordered are listed, but only abnormal results are displayed) Labs Reviewed  CBC WITH DIFFERENTIAL/PLATELET - Abnormal; Notable for the following components:      Result Value   Hemoglobin 11.4 (*)    MCV 74.0 (*)    MCH 22.5 (*)    All other components within normal limits  SARS CORONAVIRUS 2 AG (30 MIN TAT)  URINE CULTURE  COMPREHENSIVE METABOLIC PANEL  LIPASE, BLOOD  URINALYSIS, ROUTINE W REFLEX MICROSCOPIC  INFLUENZA PANEL BY PCR (TYPE A & B)  PREGNANCY, URINE    EKG None  Radiology Dg Chest Portable 1 View  Result Date: 04/24/2019 CLINICAL DATA:  Productive cough EXAM: PORTABLE CHEST 1 VIEW COMPARISON:  June 21, 2016 FINDINGS: The heart size and mediastinal contours are within normal limits. Both lungs are clear. The visualized skeletal structures are unremarkable. IMPRESSION: No active disease. Electronically Signed   By: Prudencio Pair M.D.   On: 04/24/2019 23:13    Procedures Procedures (including critical care time)  Yvonne Hancock was evaluated in Emergency Department on 04/25/2019 for the symptoms described in the history of present illness. She was evaluated in the context of the global COVID-19 pandemic, which necessitated consideration that the patient might be at risk for infection with the SARS-CoV-2 virus that causes COVID-19. Institutional protocols and algorithms that pertain to the evaluation of patients at risk for COVID-19 are in a state of rapid change based on information released by regulatory bodies  including the CDC and federal and state organizations. These policies and algorithms were followed during the patient's care in the ED.   Medications Ordered in ED Medications  ondansetron (ZOFRAN) injection 4 mg (4 mg Intravenous Given 04/24/19 2320)  sodium chloride 0.9 % bolus 1,000 mL (1,000 mLs Intravenous New Bag/Given 04/24/19 2323)     Initial Impression / Assessment and Plan / ED Course  I have reviewed the triage vital signs and the nursing notes.  Pertinent labs & imaging results that were available during my care of the patient were reviewed by me and considered in my medical decision making (see chart for details).        Yvonne Hancock is a  23 y.o. female with no significant past medical history who presents with fevers, chills, productive cough, nausea, vomiting, diarrhea, loss of taste and smell, sore throat, myalgias, headaches, and malaise.  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  She does report that she frequently gets influenza multiple times a year and this feels similar.  She would like to check for that.  She reports that her symptoms have been going on for 5 days and she has been working at her nursing facility.  She had nausea and vomiting today and was sent home.  She reports that she is having a productive cough with a green phlegm with some blood tinge.  She denies chest pain or shortness of breath.  This cough is persistent.  She reports that her temperature has been subjectively febrile.  She denies any urinary symptoms and denies any blood in her stool.  She denies abdominal pain or flank pain.  She denies specific back pain.  On exam, lungs are clear and chest is nontender.  Abdomen is nontender.  Oropharyngeal exam unremarkable with no evidence of PTA or RPA.  Normal neck range of motion with no nuchal rigidity.  No focal neurologic deficits.  Patient is tachycardic on arrival but  otherwise has normal oxygen saturation.  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  To rule out pneumonia, will get chest x-ray given the hemoptysis and productive cough.  Will get labs given the nausea, vomiting, diarrhea for several days.  We will give fluids and nausea medicine.  If work-up is completely reassuring, anticipate discharge home for rest, hydration, and nausea medication prescription.  Anticipate following up on results.  Care transferred to oncoming team while awaiting results of diagnostic work-up.  Anticipate discharge with antiemetic for nausea and vomiting if work-up is reassuring and patient maintains oxygen saturations.   Final Clinical Impressions(s) / ED Diagnoses   Final diagnoses:  Cough  Nausea vomiting and diarrhea  Chills  Myalgia  Loss of taste  Loss of smell    Clinical Impression: 1. Cough   2. Nausea vomiting and diarrhea   3. Chills   4. Myalgia   5. Loss of taste   6. Loss of smell     Disposition: Care transferred to oncoming team awaiting results of diagnostic work-up.  If patient is able to ambulate and maintain oxygen saturations and work-up is reassuring, anticipate discharge home for outpatient management with antiemetic and rest.  This note was prepared with assistance of Dragon voice recognition software. Occasional wrong-word or sound-a-like substitutions may have occurred due to the inherent limitations of voice recognition software.     Khristian Phillippi, Canary Brim, MD 04/25/19 [phone]

## 2019-04-25 LAB — URINALYSIS, ROUTINE W REFLEX MICROSCOPIC
Bilirubin Urine: NEGATIVE
Glucose, UA: NEGATIVE mg/dL
Hgb urine dipstick: NEGATIVE
Ketones, ur: NEGATIVE mg/dL
Leukocytes,Ua: NEGATIVE
Nitrite: NEGATIVE
Protein, ur: NEGATIVE mg/dL
Specific Gravity, Urine: 1.02 (ref 1.005–1.030)
pH: 8 (ref 5.0–8.0)

## 2019-04-25 LAB — COMPREHENSIVE METABOLIC PANEL
ALT: 17 U/L (ref 0–44)
AST: 24 U/L (ref 15–41)
Albumin: 4.2 g/dL (ref 3.5–5.0)
Alkaline Phosphatase: 59 U/L (ref 38–126)
Anion gap: 7 (ref 5–15)
BUN: 8 mg/dL (ref 6–20)
CO2: 23 mmol/L (ref 22–32)
Calcium: 9.2 mg/dL (ref 8.9–10.3)
Chloride: 106 mmol/L (ref 98–111)
Creatinine, Ser: 0.62 mg/dL (ref 0.44–1.00)
GFR calc Af Amer: >60 mL/min (ref >60)
GFR calc non Af Amer: >60 mL/min (ref >60)
Glucose, Bld: 110 mg/dL — ABNORMAL HIGH (ref 70–99)
Potassium: 3.5 mmol/L (ref 3.5–5.1)
Sodium: 136 mmol/L (ref 135–145)
Total Bilirubin: 0.9 mg/dL (ref 0.3–1.2)
Total Protein: 8.1 g/dL (ref 6.5–8.1)

## 2019-04-25 LAB — SARS CORONAVIRUS 2 (TAT 6-24 HRS): SARS Coronavirus 2: NEGATIVE

## 2019-04-25 LAB — SARS CORONAVIRUS 2 AG (30 MIN TAT): SARS Coronavirus 2 Ag: NEGATIVE

## 2019-04-25 LAB — LIPASE, BLOOD: Lipase: 26 U/L (ref 11–51)

## 2019-04-25 LAB — INFLUENZA PANEL BY PCR (TYPE A & B)
Influenza A By PCR: NEGATIVE
Influenza B By PCR: NEGATIVE

## 2019-04-25 LAB — PREGNANCY, URINE: Preg Test, Ur: NEGATIVE

## 2019-04-25 MED ORDER — ONDANSETRON 4 MG PO TBDP: 4.0000 mg | ORAL_TABLET | Freq: Four times a day (QID) | ORAL | 0 refills | Status: DC | PRN

## 2019-04-25 NOTE — ED Notes (Signed)
Attempted to perform swab for COVID  Pt unable to tolerate complete swab

## 2019-04-25 NOTE — ED Provider Notes (Signed)
12:15 AM  Assumed care from Dr. Sherry Ruffing.  Patient is a 23 year old healthy female who presents to the emergency department with 5 to 6 days of symptoms suggestive of COVID-19.  Has had cough, chills, body aches, nausea, vomiting, diarrhea.  No chest pain or shortness of breath.  Labs here are unremarkable.  Rapid Covid antigen is negative but will send PCR confirmation.  Chest x-ray is clear.  Plan is to ambulate patient in the ED to evaluate for any hypoxia.  Will obtain urinalysis.  Flu swab also pending.  1:08 AM  Patient's urine does not appear infected.  Covid PCR and flu pending.  She will follow-up on these results through [facility].  Patient outside treatment window at this time for Tamiflu.  Recommended continue to quarantine for at least 10 days after the onset of symptoms and symptoms should be improving and fever gone for 3 full days before returning to work.  She states nausea and vomiting have improved after Zofran.  Will discharge with prescription of the same.  Recommended alternating Tylenol and Motrin for fever and pain and Imodium over-the-counter for diarrhea.  Patient verbalized understanding and is comfortable with this plan.  No hypoxia here in the ED.  Able to ambulate and sats stayed 97% or higher.   At this time, I do not feel there is any life-threatening condition present. I have reviewed, interpreted and discussed all results (EKG, imaging, lab, urine as appropriate) and exam findings with patient/family. I have reviewed nursing notes and appropriate previous records.  I feel the patient is safe to be discharged home without further emergent workup and can continue workup as an outpatient as needed. Discussed usual and customary return precautions. Patient/family verbalize understanding and are comfortable with this plan.  Outpatient follow-up has been provided as needed. All questions have been answered.    Elmarie Slone was evaluated in Emergency Department on 04/25/2019 for  the symptoms described in the history of present illness. She was evaluated in the context of the global COVID-19 pandemic, which necessitated consideration that the patient might be at risk for infection with the SARS-CoV-2 virus that causes COVID-19. Institutional protocols and algorithms that pertain to the evaluation of patients at risk for COVID-19 are in a state of rapid change based on information released by regulatory bodies including the CDC and federal and state organizations. These policies and algorithms were followed during the patient's care in the ED.    , Delice Bison, DO 04/25/19 0109

## 2019-04-25 NOTE — ED Notes (Signed)
Phone placed at pt's bedside  [phone]

## 2019-04-25 NOTE — Discharge Instructions (Addendum)
Please quarantine for 10 days after the onset of symptoms.  You will need to be fever free without using Tylenol or Ibuprofen for 3 full days AND your symptoms will need to be significantly improving or resolved (other than the loss of taste and smell which can last up to 3 months) before you can come out of quarantine.  You should isolate from others that you live with as well.  Any close contacts that have seen you in the past 14 days before your symptoms have started will need to be notified and they will need to quarantine for 14 full days even if they have a negative COVID test.  COVID-19 is a viral illness and currently there are no specific outpatient treatments other than supportive measures such has alternating Tylenol and Ibuprofen, rest, increased fluid intake.  You do not need antibiotics.  If you develop chest pain, difficulty breathing, have blue lips or fingertips, begin vomiting and can not stop and can not hold down fluids, feel like you may pass out or you do pass out, have confusion, please return to the emergency department.   You may alternate Tylenol 1000 mg every 6 hours as needed for pain, fever and Ibuprofen 800 mg every 8 hours as needed for pain, fever.  Please take Ibuprofen with food.  Do not take more than 4000 mg of Tylenol (acetaminophen) in a 24 hour period.    Steps to find a Primary Care Provider (PCP):  Call [phone] or [phone] to access "[facility]."  2.  You may also go on the [facility] website at [email or web address]  3.  [facility] also frequently accepts new patients.  [place] [phone]  4.  There are also multiple [facility], [facility] and [facility] practices throughout the [place] that are frequently accepting new patients. You may find a clinic that is close to your home and  contact them.  [facility] [email or web address] [phone]  [facility] [email or web address]  [facility] [email or web address] [place] [email or web address] [phone]  5.  Local Health Departments also can provide primary care services.  [facility]  [place] [address] [phone]  [place] [address] [address] [facility] [place]  [place] [phone]     You may use over-the-counter Imodium as needed for diarrhea.

## 2019-04-25 NOTE — ED Notes (Signed)
Pt. Ambulated throughout room O2 sats stayed above 97% for the entirety of the walk.

## 2019-04-26 LAB — URINE CULTURE: Culture: NO GROWTH

## 2019-09-20 ENCOUNTER — Encounter (HOSPITAL_BASED_OUTPATIENT_CLINIC_OR_DEPARTMENT_OTHER): Payer: Self-pay | Admitting: *Deleted

## 2019-09-20 ENCOUNTER — Emergency Department (HOSPITAL_BASED_OUTPATIENT_CLINIC_OR_DEPARTMENT_OTHER)
Admission: EM | Admit: 2019-09-20 | Discharge: 2019-09-20 | Disposition: A | Discharge status: Home / Self Care | Payer: HRSA Program | Attending: Emergency Medicine | Admitting: Emergency Medicine

## 2019-09-20 ENCOUNTER — Other Ambulatory Visit: Payer: Self-pay

## 2019-09-20 DIAGNOSIS — R438 Other disturbances of smell and taste: Secondary | ICD-10-CM | POA: Insufficient documentation

## 2019-09-20 DIAGNOSIS — R05 Cough: Secondary | ICD-10-CM | POA: Diagnosis not present

## 2019-09-20 DIAGNOSIS — R0981 Nasal congestion: Secondary | ICD-10-CM | POA: Diagnosis not present

## 2019-09-20 DIAGNOSIS — Z20822 Contact with and (suspected) exposure to covid-19: Secondary | ICD-10-CM | POA: Diagnosis not present

## 2019-09-20 DIAGNOSIS — F17290 Nicotine dependence, other tobacco product, uncomplicated: Secondary | ICD-10-CM | POA: Diagnosis not present

## 2019-09-20 DIAGNOSIS — J069 Acute upper respiratory infection, unspecified: Secondary | ICD-10-CM | POA: Diagnosis not present

## 2019-09-20 DIAGNOSIS — R509 Fever, unspecified: Secondary | ICD-10-CM | POA: Diagnosis present

## 2019-09-20 MED ORDER — ONDANSETRON 4 MG PO TBDP
8.0000 mg | ORAL_TABLET | Freq: Once | ORAL | Status: AC
  Administered 2019-09-20: 8 mg via ORAL
  Filled 2019-09-20: qty 2

## 2019-09-20 MED ORDER — ONDANSETRON 8 MG PO TBDP: 8.0000 mg | ORAL_TABLET | Freq: Three times a day (TID) | ORAL | 0 refills | Status: DC | PRN

## 2019-09-20 MED ORDER — ALBUTEROL SULFATE HFA 108 (90 BASE) MCG/ACT IN AERS
2.0000 | INHALATION_SPRAY | RESPIRATORY_TRACT | Status: DC | PRN
  Administered 2019-09-20: 2 via RESPIRATORY_TRACT
  Filled 2019-09-20: qty 6.7

## 2019-09-20 MED ORDER — OXYMETAZOLINE HCL 0.05 % NA SOLN
2.0000 | Freq: Two times a day (BID) | NASAL | Status: DC | PRN
  Administered 2019-09-20: 2 via NASAL
  Filled 2019-09-20: qty 30

## 2019-09-20 NOTE — ED Triage Notes (Signed)
Bodyaches. States she has a cough, facial and ear pain. States she wants a Covid test.

## 2019-09-20 NOTE — ED Provider Notes (Signed)
[facility] Provider Note: [name], MD, FACEP  CSN: [id number] MRN: [id number] ARRIVAL: 09/20/19 at 2018 ROOM: MH07/MH07   CHIEF COMPLAINT  Cough   HISTORY OF PRESENT ILLNESS  09/20/19 11:02 PM Yvonne Hancock is a 24 y.o. female with a 3-day history of URI symptoms.  Specifically she has had fever to 101 which she has controlled with Tylenol.  She has had nasal congestion, sinus and ear pressure, sore throat and cough.  The worst symptom is the cough which is severe enough to keep her from sleeping and to prevent her from eating due to posttussive emesis.  She has lost her sense of smell and taste.  She denies nausea, vomiting or diarrhea.  She has been taking NyQuil and DayQuil without adequate relief.   History reviewed. No pertinent past medical history.  Past Surgical History:  Procedure Laterality Date  . MOUTH SURGERY      No family history on file.  Social History   Tobacco Use  . Smoking status: Current Every Day Smoker    Types: Cigars  . Smokeless tobacco: Never Used  Substance Use Topics  . Alcohol use: Yes    Comment: social  . Drug use: No    Comment: states she smokes CBD    Prior to Admission medications   Medication Sig Start Date End Date Taking? Authorizing Provider  ondansetron (ZOFRAN ODT) 8 MG disintegrating tablet Take 1 tablet (8 mg total) by mouth every 8 (eight) hours as needed for nausea or vomiting. 09/20/19   Joseh Sjogren, Jonny Ruiz, MD    Allergies Patient has no known allergies.   REVIEW OF SYSTEMS  Negative except as noted here or in the History of Present Illness.   PHYSICAL EXAMINATION  Initial Vital Signs Blood pressure (!) 130/91, pulse (!) 106, temperature 98.4 F (36.9 C), temperature source Oral, resp. rate 20, height 5\' 2"  (1.575 m), weight 56.7 kg, last menstrual period 09/07/2019, SpO2 100 %.  Examination General: Well-developed, well-nourished female in no acute distress; appearance consistent with age of  record HENT: normocephalic; atraumatic; nasal congestion; mild pharyngeal erythema without exudates; TMs normal Eyes: pupils equal, round and reactive to light; extraocular muscles intact Neck: supple Heart: regular rate and rhythm Lungs: clear to auscultation bilaterally; persistent cough Abdomen: soft; nondistended; nontender; bowel sounds present Extremities: No deformity; full range of motion; pulses normal Neurologic: Awake, alert and oriented; motor function intact in all extremities and symmetric; no facial droop Skin: Warm and dry Psychiatric: Normal mood and affect   RESULTS  Summary of this visit's results, reviewed and interpreted by myself:   EKG Interpretation  Date/Time:    Ventricular Rate:    PR Interval:    QRS Duration:   QT Interval:    QTC Calculation:   R Axis:     Text Interpretation:        Laboratory Studies: No results found for this or any previous visit (from the past 24 hour(s)). Imaging Studies: No results found.  ED COURSE and MDM  Nursing notes, initial and subsequent vitals signs, including pulse oximetry, reviewed and interpreted by myself.  Vitals:   09/20/19 2022 09/20/19 2025  BP:  (!) 130/91  Pulse:  (!) 106  Resp:  20  Temp:  98.4 F (36.9 C)  TempSrc:  Oral  SpO2:  100%  Weight: 56.7 kg   Height: 5\' 2"  (1.575 m)    Medications  ondansetron (ZOFRAN-ODT) disintegrating tablet 8 mg (has no administration in time range)  oxymetazoline (AFRIN) 0.05 % nasal spray 2 spray (has no administration in time range)  albuterol (VENTOLIN HFA) 108 (90 Base) MCG/ACT inhaler 2 puff (has no administration in time range)    Patient likely has COVID-19.  We will treat her cough with an albuterol inhaler as she is already using over-the-counter cough suppressants. We will also provide Afrin to help open her sinus passages.  PROCEDURES  Procedures   ED DIAGNOSES     ICD-10-CM   1. Suspected COVID-19 virus infection  Z20.822   2. Viral  URI with cough  J06.9        Vence Lalor, MD 09/20/19 2316

## 2019-09-21 LAB — SARS CORONAVIRUS 2 (TAT 6-24 HRS): SARS Coronavirus 2: NEGATIVE

## 2019-12-07 ENCOUNTER — Encounter (HOSPITAL_BASED_OUTPATIENT_CLINIC_OR_DEPARTMENT_OTHER): Payer: Self-pay | Admitting: Emergency Medicine

## 2019-12-07 ENCOUNTER — Other Ambulatory Visit: Payer: Self-pay

## 2019-12-07 ENCOUNTER — Emergency Department (HOSPITAL_BASED_OUTPATIENT_CLINIC_OR_DEPARTMENT_OTHER)
Admission: EM | Admit: 2019-12-07 | Discharge: 2019-12-07 | Disposition: A | Discharge status: Home / Self Care | Payer: Medicaid Other | Attending: Emergency Medicine | Admitting: Emergency Medicine

## 2019-12-07 DIAGNOSIS — F17290 Nicotine dependence, other tobacco product, uncomplicated: Secondary | ICD-10-CM | POA: Insufficient documentation

## 2019-12-07 DIAGNOSIS — N3001 Acute cystitis with hematuria: Secondary | ICD-10-CM | POA: Insufficient documentation

## 2019-12-07 LAB — URINALYSIS, ROUTINE W REFLEX MICROSCOPIC
Bilirubin Urine: NEGATIVE
Glucose, UA: NEGATIVE mg/dL
Ketones, ur: NEGATIVE mg/dL
Nitrite: NEGATIVE
Protein, ur: 30 mg/dL — AB
Specific Gravity, Urine: >1.03 — ABNORMAL HIGH (ref 1.005–1.030)
pH: 6.5 (ref 5.0–8.0)

## 2019-12-07 LAB — WET PREP, GENITAL
Sperm: NONE SEEN
Trich, Wet Prep: NONE SEEN
Yeast Wet Prep HPF POC: NONE SEEN

## 2019-12-07 LAB — URINALYSIS, MICROSCOPIC (REFLEX)
RBC / HPF: >50 RBC/hpf (ref 0–5)
WBC, UA: >50 WBC/hpf (ref 0–5)

## 2019-12-07 LAB — PREGNANCY, URINE: Preg Test, Ur: NEGATIVE

## 2019-12-07 MED ORDER — FLUCONAZOLE 150 MG PO TABS
ORAL_TABLET | ORAL | 0 refills | Status: AC
Start: 2019-12-07 — End: ?

## 2019-12-07 MED ORDER — CEPHALEXIN 500 MG PO CAPS
500.0000 mg | ORAL_CAPSULE | Freq: Two times a day (BID) | ORAL | 0 refills | Status: AC
Start: 2019-12-07 — End: ?

## 2019-12-07 MED ORDER — FLUCONAZOLE 150 MG PO TABS
150.0000 mg | ORAL_TABLET | Freq: Once | ORAL | Status: AC
  Administered 2019-12-07: 150 mg via ORAL
  Filled 2019-12-07: qty 1

## 2019-12-07 MED ORDER — CEPHALEXIN 250 MG PO CAPS
500.0000 mg | ORAL_CAPSULE | Freq: Once | ORAL | Status: AC
  Administered 2019-12-07: 500 mg via ORAL
  Filled 2019-12-07: qty 2

## 2019-12-07 MED ORDER — PHENAZOPYRIDINE HCL 200 MG PO TABS: 200.0000 mg | ORAL_TABLET | Freq: Three times a day (TID) | ORAL | 0 refills | Status: AC | PRN

## 2019-12-07 MED ORDER — PHENAZOPYRIDINE HCL 100 MG PO TABS
200.0000 mg | ORAL_TABLET | Freq: Once | ORAL | Status: AC
  Administered 2019-12-07: 200 mg via ORAL
  Filled 2019-12-07: qty 2

## 2019-12-07 NOTE — ED Triage Notes (Signed)
Pt reports vaginal discharge similar to hx of yeast infection. Also reports dysuria, cramping when emptying bladder, hematuria. Also requesting STD check

## 2019-12-07 NOTE — ED Provider Notes (Signed)
[facility] Provider Note: [name], MD, FACEP  CSN: [id number] MRN: [id number] ARRIVAL: 12/07/19 at 0155 ROOM: MH10/MH10   CHIEF COMPLAINT  Vaginal Discharge and Dysuria   HISTORY OF PRESENT ILLNESS  12/07/19 3:30 AM Yvonne Hancock is a 24 y.o. female with a 2-day history of dysuria.  She describes this is burning with urination which she rates as a 10 out of 10.  She is also having cramping when emptying her bladder, urinary urgency and frequency.  She denies associated fever, chills, back pain, nausea, vomiting or diarrhea.  She has noticed some blood in her urine.  She also has the sensation that she has a vaginal yeast infection.   History reviewed. No pertinent past medical history.  Past Surgical History:  Procedure Laterality Date  . MOUTH SURGERY      History reviewed. No pertinent family history.  Social History   Tobacco Use  . Smoking status: Current Every Day Smoker    Types: Cigars  . Smokeless tobacco: Never Used  Vaping Use  . Vaping Use: Never used  Substance Use Topics  . Alcohol use: Yes    Comment: social  . Drug use: Yes    Types: Marijuana    Comment: states she smokes CBD    Prior to Admission medications   Medication Sig Start Date End Date Taking? Authorizing Provider  medroxyPROGESTERone Acetate (DEPO-PROVERA) 150 MG/ML SUSY Depo-Provera 150 mg/mL intramuscular syringe  Inject 1 mL every 3 months by intramuscular route. 09/04/15  Yes [provider]  cephALEXin (KEFLEX) 500 MG capsule Take 1 capsule (500 mg total) by mouth 2 (two) times daily. 12/07/19   Mazie Fencl, MD  fluconazole (DIFLUCAN) 150 MG tablet Take 1 tablet in 3 days if symptoms of vaginal yeast infection persist. 12/07/19   Roddie Riegler, Jonny Ruiz, MD  phenazopyridine (PYRIDIUM) 200 MG tablet Take 1 tablet (200 mg total) by mouth 3 (three) times daily as needed for pain. 12/07/19   Roanne Haye, Jonny Ruiz, MD    Allergies Patient has no known allergies.   REVIEW OF  SYSTEMS  Negative except as noted here or in the History of Present Illness.   PHYSICAL EXAMINATION  Initial Vital Signs Blood pressure 113/80, pulse 82, temperature 98.2 F (36.8 C), temperature source Oral, resp. rate 18, weight 55.1 kg, last menstrual period 11/21/2019, SpO2 100 %.  Examination General: Well-developed, well-nourished female in no acute distress; appearance consistent with age of record HENT: normocephalic; atraumatic Eyes: pupils equal, round and reactive to light; extraocular muscles intact Neck: supple Heart: regular rate and rhythm Lungs: clear to auscultation bilaterally Abdomen: soft; nondistended; suprapubic tenderness; no masses or hepatosplenomegaly; bowel sounds present GU: Normal external genitalia; curd-like vaginal discharge; no vaginal bleeding; no cervical motion tenderness; bladder tenderness Extremities: No deformity; full range of motion; pulses normal Neurologic: Awake, alert and oriented; motor function intact in all extremities and symmetric; no facial droop Skin: Warm and dry Psychiatric: Normal mood and affect   RESULTS  Summary of this visit's results, reviewed and interpreted by myself:   EKG Interpretation  Date/Time:    Ventricular Rate:    PR Interval:    QRS Duration:   QT Interval:    QTC Calculation:   R Axis:     Text Interpretation:        Laboratory Studies: Results for orders placed or performed during the hospital encounter of 12/07/19 (from the past 24 hour(s))  Urinalysis, Routine w reflex microscopic     Status: Abnormal  Collection Time: 12/07/19  2:10 AM  Result Value Ref Range   Color, Urine YELLOW YELLOW   APPearance CLOUDY (A) CLEAR   Specific Gravity, Urine >1.030 (H) 1.005 - 1.030   pH 6.5 5.0 - 8.0   Glucose, UA NEGATIVE NEGATIVE mg/dL   Hgb urine dipstick LARGE (A) NEGATIVE   Bilirubin Urine NEGATIVE NEGATIVE   Ketones, ur NEGATIVE NEGATIVE mg/dL   Protein, ur 30 (A) NEGATIVE mg/dL   Nitrite  NEGATIVE NEGATIVE   Leukocytes,Ua SMALL (A) NEGATIVE  Pregnancy, urine     Status: None   Collection Time: 12/07/19  2:10 AM  Result Value Ref Range   Preg Test, Ur NEGATIVE NEGATIVE  Urinalysis, Microscopic (reflex)     Status: Abnormal   Collection Time: 12/07/19  2:10 AM  Result Value Ref Range   RBC / HPF >50 0 - 5 RBC/hpf   WBC, UA >50 0 - 5 WBC/hpf   Bacteria, UA MANY (A) NONE SEEN   Squamous Epithelial / LPF 6-10 0 - 5  Wet prep, genital     Status: Abnormal   Collection Time: 12/07/19  3:37 AM  Result Value Ref Range   Yeast Wet Prep HPF POC NONE SEEN NONE SEEN   Trich, Wet Prep NONE SEEN NONE SEEN   Clue Cells Wet Prep HPF POC PRESENT (A) NONE SEEN   WBC, Wet Prep HPF POC FEW (A) NONE SEEN   Sperm NONE SEEN    Imaging Studies: No results found.  ED COURSE and MDM  Nursing notes, initial and subsequent vitals signs, including pulse oximetry, reviewed and interpreted by myself.  Vitals:   12/07/19 0201 12/07/19 0202  BP:  113/80  Pulse:  82  Resp:  18  Temp:  98.2 F (36.8 C)  TempSrc:  Oral  SpO2:  100%  Weight: 55.1 kg    Medications  fluconazole (DIFLUCAN) tablet 150 mg (has no administration in time range)  cephALEXin (KEFLEX) capsule 500 mg (has no administration in time range)  phenazopyridine (PYRIDIUM) tablet 200 mg (has no administration in time range)    Urinalysis consistent with urinary tract infection with hematuria.  We will treat with Keflex.  Urine sent for culture.  GC and Chlamydia testing pending.  Although no yeast was seen on her wet prep her clinical presentation suggests a yeast infection and will go ahead and treat with Diflucan.   PROCEDURES  Procedures   ED DIAGNOSES     ICD-10-CM   1. Acute cystitis with hematuria  N30.01        Rania Prothero, Jonny Ruiz, MD 12/07/19 [phone]

## 2019-12-09 LAB — URINE CULTURE: Culture: >=100000 — AB

## 2019-12-10 ENCOUNTER — Telehealth: Payer: Self-pay | Admitting: *Deleted

## 2019-12-10 LAB — GC/CHLAMYDIA PROBE AMP (~~LOC~~) NOT AT ARMC
Chlamydia: POSITIVE — AB
Comment: NEGATIVE
Comment: NORMAL
Neisseria Gonorrhea: NEGATIVE

## 2019-12-10 NOTE — Telephone Encounter (Signed)
Post ED Visit - Positive Culture Follow-up  Culture report reviewed by antimicrobial stewardship pharmacist: [facility] []  Nathan Batchelder, Pharm.D. []  , Pharm.D., BCPS AQ-ID []  , Pharm.D., BCPS []  Celedonio Miyamoto, Pharm.D., BCPS []  [place], Garvin Fila.D., BCPS, AAHIVP []  , Pharm.D., BCPS, AAHIVP []  Georgina Pillion, PharmD, BCPS []  , PharmD, BCPS []  [place], PharmD, BCPS []  [address], PharmD []  , PharmD, BCPS []  Estella Husk, PharmD  Pharmacy Team []  Lysle Pearl, PharmD []  , PharmD []  Phillips Climes, PharmD []  , Rph []  Agapito Games) , PharmD []  Verlan Friends, PharmD []  , PharmD []  Mervyn Gay, PharmD []  , PharmD []  Vinnie Level, PharmD []  [facility], PharmD []  , PharmD []  Len Childs, PharmD   Positive urine culture Treated with Cephalexin, organism sensitive to the same and no further patient follow-up is required at this time. , Pharm D  Greer Pickerel [place] 12/10/2019, 11:46 AM

## 2020-06-14 IMAGING — DX DG CHEST 1V PORT
1 series · 1 of 1 positions shown · non-contrast
Comparison: June 21, 2016

CLINICAL DATA: Productive cough

EXAM:
PORTABLE CHEST 1 VIEW

[chest ap]
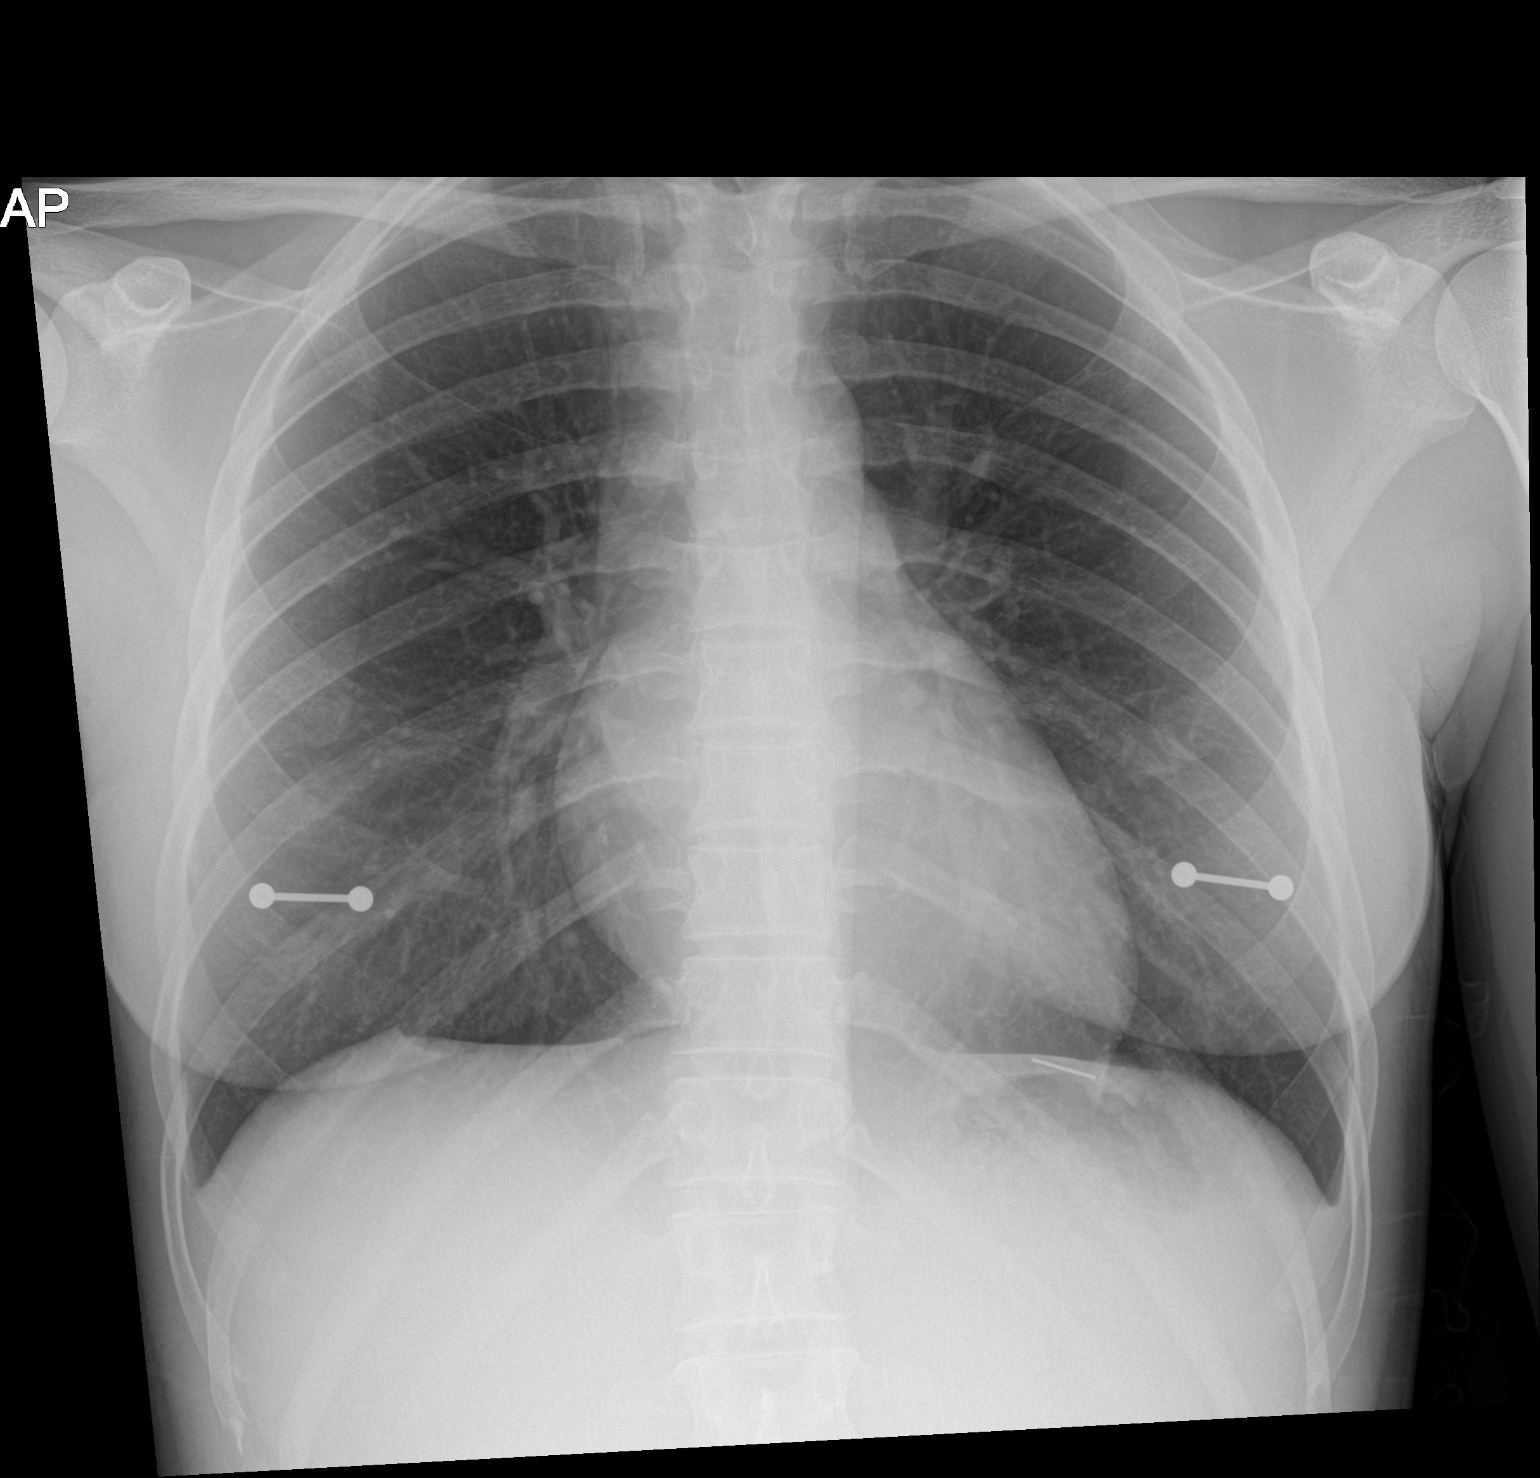

[1 of 1 positions shown; findings below may reference images not displayed]

FINDINGS: The heart size and mediastinal contours are within normal limits.
Both lungs are clear. The visualized skeletal structures are
unremarkable.
IMPRESSION: No active disease.

## 2020-10-02 ENCOUNTER — Other Ambulatory Visit: Payer: Self-pay

## 2020-10-02 ENCOUNTER — Encounter (HOSPITAL_BASED_OUTPATIENT_CLINIC_OR_DEPARTMENT_OTHER): Payer: Self-pay | Admitting: *Deleted

## 2020-10-02 ENCOUNTER — Emergency Department (HOSPITAL_BASED_OUTPATIENT_CLINIC_OR_DEPARTMENT_OTHER)
Admission: EM | Admit: 2020-10-02 | Discharge: 2020-10-02 | Disposition: A | Discharge status: Home / Self Care | Payer: Medicaid Other | Attending: Emergency Medicine | Admitting: Emergency Medicine

## 2020-10-02 DIAGNOSIS — Z20822 Contact with and (suspected) exposure to covid-19: Secondary | ICD-10-CM | POA: Insufficient documentation

## 2020-10-02 DIAGNOSIS — Z28310 Unvaccinated for covid-19: Secondary | ICD-10-CM | POA: Insufficient documentation

## 2020-10-02 DIAGNOSIS — F17290 Nicotine dependence, other tobacco product, uncomplicated: Secondary | ICD-10-CM | POA: Insufficient documentation

## 2020-10-02 DIAGNOSIS — R509 Fever, unspecified: Secondary | ICD-10-CM | POA: Insufficient documentation

## 2020-10-02 LAB — RESP PANEL BY RT-PCR (FLU A&B, COVID) ARPGX2
Influenza A by PCR: POSITIVE — AB
Influenza B by PCR: NEGATIVE
SARS Coronavirus 2 by RT PCR: NEGATIVE

## 2020-10-02 NOTE — ED Triage Notes (Signed)
HA, loss of taste and smell, fever x 2 days. tmax 102. Denies covid exposure

## 2020-10-02 NOTE — Discharge Instructions (Signed)
You were tested for COVID and flu.  Your results should be available in MyChart tomorrow.  If you have COVID you will need to isolate for 5 days and then wear a mask for another 5 days. Please see CDC guidelines below  Take Tylenol or Motrin for fever  Return to ER if you have trouble breathing, persistent fever, dehydration.     Person Under Monitoring Name: Yvonne Hancock  Location: [address] [place] [address]   Infection Prevention Recommendations for Individuals Confirmed to have, or Being Evaluated for, 2019 Novel Coronavirus (COVID-19) Infection Who Receive Care at Home  Individuals who are confirmed to have, or are being evaluated for, COVID-19 should follow the prevention steps below until a healthcare provider or local or state health department says they can return to normal activities.  Stay home except to get medical care You should restrict activities outside your home, except for getting medical care. Do not go to work, school, or public areas, and do not use public transportation or taxis.  Call ahead before visiting your doctor Before your medical appointment, call the healthcare provider and tell them that you have, or are being evaluated for, COVID-19 infection. This will help the healthcare provider's office take steps to keep other people from getting infected. Ask your healthcare provider to call the local or state health department.  Monitor your symptoms Seek prompt medical attention if your illness is worsening (e.g., difficulty breathing). Before going to your medical appointment, call the healthcare provider and tell them that you have, or are being evaluated for, COVID-19 infection. Ask your healthcare provider to call the local or state health department.  Wear a facemask You should wear a facemask that covers your nose and mouth when you are in the same room with other people and when you visit a healthcare provider. People who live with  or visit you should also wear a facemask while they are in the same room with you.  Separate yourself from other people in your home As much as possible, you should stay in a different room from other people in your home. Also, you should use a separate bathroom, if available.  Avoid sharing household items You should not share dishes, drinking glasses, cups, eating utensils, towels, bedding, or other items with other people in your home. After using these items, you should wash them thoroughly with soap and water.  Cover your coughs and sneezes Cover your mouth and nose with a tissue when you cough or sneeze, or you can cough or sneeze into your sleeve. Throw used tissues in a lined trash can, and immediately wash your hands with soap and water for at least 20 seconds or use an alcohol-based hand rub.  Wash your [employer] your hands often and thoroughly with soap and water for at least 20 seconds. You can use an alcohol-based hand sanitizer if soap and water are not available and if your hands are not visibly dirty. Avoid touching your eyes, nose, and mouth with unwashed hands.   Prevention Steps for Caregivers and Household Members of Individuals Confirmed to have, or Being Evaluated for, COVID-19 Infection Being Cared for in the Home  If you live with, or provide care at home for, a person confirmed to have, or being evaluated for, COVID-19 infection please follow these guidelines to prevent infection:  Follow healthcare provider's instructions Make sure that you understand and can help the patient follow any healthcare provider instructions for all care.  Provide  for the patient's basic needs You should help the patient with basic needs in the home and provide support for getting groceries, prescriptions, and other personal needs.  Monitor the patient's symptoms If they are getting sicker, call his or her medical provider and tell them that the patient has, or is being  evaluated for, COVID-19 infection. This will help the healthcare provider's office take steps to keep other people from getting infected. Ask the healthcare provider to call the local or state health department.  Limit the number of people who have contact with the patient If possible, have only one caregiver for the patient. Other household members should stay in another home or place of residence. If this is not possible, they should stay in another room, or be separated from the patient as much as possible. Use a separate bathroom, if available. Restrict visitors who do not have an essential need to be in the home.  Keep older adults, very young children, and other sick people away from the patient Keep older adults, very young children, and those who have compromised immune systems or chronic health conditions away from the patient. This includes people with chronic heart, lung, or kidney conditions, diabetes, and cancer.  Ensure good ventilation Make sure that shared spaces in the home have good air flow, such as from an air conditioner or an opened window, weather permitting.  Wash your hands often Wash your hands often and thoroughly with soap and water for at least 20 seconds. You can use an alcohol based hand sanitizer if soap and water are not available and if your hands are not visibly dirty. Avoid touching your eyes, nose, and mouth with unwashed hands. Use disposable paper towels to dry your hands. If not available, use dedicated cloth towels and replace them when they become wet.  Wear a facemask and gloves Wear a disposable facemask at all times in the room and gloves when you touch or have contact with the patient's blood, body fluids, and/or secretions or excretions, such as sweat, saliva, sputum, nasal mucus, vomit, urine, or feces.  Ensure the mask fits over your nose and mouth tightly, and do not touch it during use. Throw out disposable facemasks and gloves after using  them. Do not reuse. Wash your hands immediately after removing your facemask and gloves. If your personal clothing becomes contaminated, carefully remove clothing and launder. Wash your hands after handling contaminated clothing. Place all used disposable facemasks, gloves, and other waste in a lined container before disposing them with other household waste. Remove gloves and wash your hands immediately after handling these items.  Do not share dishes, glasses, or other household items with the patient Avoid sharing household items. You should not share dishes, drinking glasses, cups, eating utensils, towels, bedding, or other items with a patient who is confirmed to have, or being evaluated for, COVID-19 infection. After the person uses these items, you should wash them thoroughly with soap and water.  Wash laundry thoroughly Immediately remove and wash clothes or bedding that have blood, body fluids, and/or secretions or excretions, such as sweat, saliva, sputum, nasal mucus, vomit, urine, or feces, on them. Wear gloves when handling laundry from the patient. Read and follow directions on labels of laundry or clothing items and detergent. In general, wash and dry with the warmest temperatures recommended on the label.  Clean all areas the individual has used often Clean all touchable surfaces, such as counters, tabletops, doorknobs, bathroom fixtures, toilets, phones, keyboards, tablets, and  bedside tables, every day. Also, clean any surfaces that may have blood, body fluids, and/or secretions or excretions on them. Wear gloves when cleaning surfaces the patient has come in contact with. Use a diluted bleach solution (e.g., dilute bleach with 1 part bleach and 10 parts water) or a household disinfectant with a label that says EPA-registered for coronaviruses. To make a bleach solution at home, add 1 tablespoon of bleach to 1 quart (4 cups) of water. For a larger supply, add  cup of bleach to 1  gallon (16 cups) of water. Read labels of cleaning products and follow recommendations provided on product labels. Labels contain instructions for safe and effective use of the cleaning product including precautions you should take when applying the product, such as wearing gloves or eye protection and making sure you have good ventilation during use of the product. Remove gloves and wash hands immediately after cleaning.  Monitor yourself for signs and symptoms of illness Caregivers and household members are considered close contacts, should monitor their health, and will be asked to limit movement outside of the home to the extent possible. Follow the monitoring steps for close contacts listed on the symptom monitoring form.   ? If you have additional questions, contact your local health department or call the epidemiologist on call at [phone] (available 24/7). ? This guidance is subject to change. For the most up-to-date guidance from [facility], please refer to their website: [email or web address]

## 2020-10-02 NOTE — ED Provider Notes (Signed)
[facility] EMERGENCY DEPARTMENT Provider Note   CSN: [id number] Arrival date & time: 10/02/20  2158     History Chief Complaint  Patient presents with  . URI    Yvonne Hancock is a 25 y.o. female here presenting with possible COVID.  Patient states that for the last 2 days, she has been having loss of smell and fever.  T-max of 102.  Patient states that she is not vaccinated against COVID.  No known close contacts with COVID.  Patient's been taking Tylenol and last Tylenol use was 2 hours ago.  Denies any cough or abdominal pain or vomiting  The history is provided by the patient.       History reviewed. No pertinent past medical history.  There are no problems to display for this patient.   Past Surgical History:  Procedure Laterality Date  . MOUTH SURGERY       OB History   No obstetric history on file.     History reviewed. No pertinent family history.  Social History   Tobacco Use  . Smoking status: Current Every Day Smoker    Types: Cigars  . Smokeless tobacco: Never Used  Vaping Use  . Vaping Use: Never used  Substance Use Topics  . Alcohol use: Yes    Comment: social  . Drug use: Yes    Types: Marijuana    Comment: states she smokes CBD    Home Medications Prior to Admission medications   Medication Sig Start Date End Date Taking? Authorizing Provider  cephALEXin (KEFLEX) 500 MG capsule Take 1 capsule (500 mg total) by mouth 2 (two) times daily. 12/07/19   Molpus, John, MD  fluconazole (DIFLUCAN) 150 MG tablet Take 1 tablet in 3 days if symptoms of vaginal yeast infection persist. 12/07/19   Molpus, John, MD  medroxyPROGESTERone Acetate (DEPO-PROVERA) 150 MG/ML SUSY Depo-Provera 150 mg/mL intramuscular syringe  Inject 1 mL every 3 months by intramuscular route. 09/04/15   [provider]  phenazopyridine (PYRIDIUM) 200 MG tablet Take 1 tablet (200 mg total) by mouth 3 (three) times daily as needed for pain. 12/07/19   Molpus, Jonny Ruiz,  MD    Allergies    Patient has no known allergies.  Review of Systems   Review of Systems  Constitutional: Positive for fever.  All other systems reviewed and are negative.   Physical Exam Updated Vital Signs BP 118/80 (BP Location: Left Arm)   Pulse 88   Temp 98.6 F (37 C) (Oral)   Resp 20   Ht 5\' 1"  (1.549 m)   Wt 56.7 kg   LMP 08/27/2020   SpO2 100%   BMI 23.62 kg/m   Physical Exam Vitals and nursing note reviewed.  Constitutional:      Appearance: Normal appearance.     Comments: Well-appearing well-hydrated  HENT:     Head: Normocephalic.     Right Ear: Tympanic membrane normal.     Left Ear: Tympanic membrane normal.     Nose: Nose normal.     Mouth/Throat:     Mouth: Mucous membranes are moist.  Eyes:     Extraocular Movements: Extraocular movements intact.     Pupils: Pupils are equal, round, and reactive to light.  Cardiovascular:     Rate and Rhythm: Normal rate and regular rhythm.     Pulses: Normal pulses.     Heart sounds: Normal heart sounds.  Pulmonary:     Effort: Pulmonary effort is normal.  Breath sounds: Normal breath sounds.  Abdominal:     General: Abdomen is flat.     Palpations: Abdomen is soft.  Musculoskeletal:        General: Normal range of motion.     Cervical back: Normal range of motion and neck supple.  Skin:    General: Skin is warm.     Capillary Refill: Capillary refill takes less than 2 seconds.  Neurological:     General: No focal deficit present.     Mental Status: She is alert and oriented to person, place, and time.  Psychiatric:        Mood and Affect: Mood normal.        Behavior: Behavior normal.     ED Results / Procedures / Treatments   Labs (all labs ordered are listed, but only abnormal results are displayed) Labs Reviewed  RESP PANEL BY RT-PCR (FLU A&B, COVID) [id number]    EKG None  Radiology No results found.  Procedures Procedures   Medications Ordered in ED Medications - No data to  display  ED Course  I have reviewed the triage vital signs and the nursing notes.  Pertinent labs & imaging results that were available during my care of the patient were reviewed by me and considered in my medical decision making (see chart for details).    MDM Rules/Calculators/A&P                         Yvonne Hancock is a 25 y.o. female here with possible COVID.  Patient has loss of taste and smell.  Patient has fevers at home.  Afebrile in the ED but took Tylenol prior to arrival.  Vitals are stable.  Oxygen is 100%.  Swab her for COVID and flu.  Told her to stay home until results come back. If she has COVID, she needs to isolate per guidelines   Yvonne Hancock was evaluated in Emergency Department on 10/02/2020 for the symptoms described in the history of present illness. She was evaluated in the context of the global COVID-19 pandemic, which necessitated consideration that the patient might be at risk for infection with the SARS-CoV-2 virus that causes COVID-19. Institutional protocols and algorithms that pertain to the evaluation of patients at risk for COVID-19 are in a state of rapid change based on information released by regulatory bodies including the CDC and federal and state organizations. These policies and algorithms were followed during the patient's care in the ED.    Final Clinical Impression(s) / ED Diagnoses Final diagnoses:  None    Rx / DC Orders ED Discharge Orders    None       Charlynne Pander, MD 10/02/20 2228

## 2020-10-02 NOTE — ED Notes (Signed)
Patient transported to Ultrasound
